# Patient Record
Sex: Male | Born: 1960 | Race: White | Hispanic: No | Marital: Married
Health system: Southern US, Community
[De-identification: ages and names within clinical notes are randomized; demographics above are authoritative.]

## PROBLEM LIST (undated history)

## (undated) DIAGNOSIS — I10 Essential (primary) hypertension: Secondary | ICD-10-CM

---

## 2010-11-09 ENCOUNTER — Emergency Department (HOSPITAL_BASED_OUTPATIENT_CLINIC_OR_DEPARTMENT_OTHER)
Admission: EM | Admit: 2010-11-09 | Discharge: 2010-11-09 | Payer: Self-pay | Source: Home / Self Care | Admitting: Emergency Medicine

## 2011-07-30 ENCOUNTER — Inpatient Hospital Stay (HOSPITAL_COMMUNITY)
Admission: EM | Admit: 2011-07-30 | Discharge: 2011-08-01 | DRG: 313 | Disposition: A | Discharge status: Home / Self Care | Payer: Self-pay | Attending: Internal Medicine | Admitting: Internal Medicine

## 2011-07-30 ENCOUNTER — Emergency Department (HOSPITAL_COMMUNITY): Payer: Self-pay

## 2011-07-30 DIAGNOSIS — M549 Dorsalgia, unspecified: Secondary | ICD-10-CM | POA: Diagnosis present

## 2011-07-30 DIAGNOSIS — K219 Gastro-esophageal reflux disease without esophagitis: Secondary | ICD-10-CM | POA: Diagnosis present

## 2011-07-30 DIAGNOSIS — R0789 Other chest pain: Principal | ICD-10-CM | POA: Diagnosis present

## 2011-07-30 DIAGNOSIS — I10 Essential (primary) hypertension: Secondary | ICD-10-CM | POA: Diagnosis present

## 2011-07-30 DIAGNOSIS — I452 Bifascicular block: Secondary | ICD-10-CM | POA: Diagnosis present

## 2011-07-30 LAB — DIFFERENTIAL
Basophils Absolute: 0 10*3/uL (ref 0.0–0.1)
Basophils Relative: 0 % (ref 0–1)
Eosinophils Absolute: 0.3 10*3/uL (ref 0.0–0.7)
Eosinophils Relative: 3 % (ref 0–5)
Lymphocytes Relative: 34 % (ref 12–46)
Lymphs Abs: 3.3 10*3/uL (ref 0.7–4.0)
Monocytes Absolute: 0.7 10*3/uL (ref 0.1–1.0)
Monocytes Relative: 7 % (ref 3–12)
Neutro Abs: 5.3 10*3/uL (ref 1.7–7.7)
Neutrophils Relative %: 55 % (ref 43–77)

## 2011-07-30 LAB — CBC
HCT: 41.3 % (ref 39.0–52.0)
Hemoglobin: 14.6 g/dL (ref 13.0–17.0)
MCH: 30.6 pg (ref 26.0–34.0)
MCHC: 35.4 g/dL (ref 30.0–36.0)
MCV: 86.6 fL (ref 78.0–100.0)
Platelets: 243 10*3/uL (ref 150–400)
RBC: 4.77 MIL/uL (ref 4.22–5.81)
RDW: 12.7 % (ref 11.5–15.5)
WBC: 9.5 10*3/uL (ref 4.0–10.5)

## 2011-07-30 LAB — POCT I-STAT TROPONIN I: Troponin i, poc: 0.01 ng/mL (ref 0.00–0.08)

## 2011-07-31 ENCOUNTER — Observation Stay (HOSPITAL_COMMUNITY): Payer: Self-pay

## 2011-07-31 ENCOUNTER — Emergency Department (HOSPITAL_COMMUNITY): Payer: Self-pay

## 2011-07-31 ENCOUNTER — Encounter (HOSPITAL_COMMUNITY): Payer: Self-pay | Admitting: Radiology

## 2011-07-31 DIAGNOSIS — R079 Chest pain, unspecified: Secondary | ICD-10-CM

## 2011-07-31 LAB — CARDIAC PANEL(CRET KIN+CKTOT+MB+TROPI)
CK, MB: 2.4 ng/mL (ref 0.3–4.0)
CK, MB: 2.4 ng/mL (ref 0.3–4.0)
Relative Index: INVALID (ref 0.0–2.5)
Relative Index: INVALID (ref 0.0–2.5)
Total CK: 85 U/L (ref 7–232)
Troponin I: <0.3 ng/mL (ref <0.30)
Troponin I: <0.3 ng/mL (ref <0.30)

## 2011-07-31 LAB — LIPID PANEL
Cholesterol: 208 mg/dL — ABNORMAL HIGH (ref 0–200)
Total CHOL/HDL Ratio: 5.9 RATIO

## 2011-07-31 LAB — BASIC METABOLIC PANEL
BUN: 24 mg/dL — ABNORMAL HIGH (ref 6–23)
BUN: 24 mg/dL — ABNORMAL HIGH (ref 6–23)
CO2: 27 mEq/L (ref 19–32)
Calcium: 9.4 mg/dL (ref 8.4–10.5)
Chloride: 106 mEq/L (ref 96–112)
Chloride: 103 mEq/L (ref 96–112)
Creatinine, Ser: 1.13 mg/dL (ref 0.50–1.35)
Creatinine, Ser: 0.95 mg/dL (ref 0.50–1.35)
GFR calc Af Amer: >60 mL/min (ref >60)
GFR calc Af Amer: >60 mL/min (ref >60)
GFR calc non Af Amer: >60 mL/min (ref >60)
GFR calc non Af Amer: >60 mL/min (ref >60)
Glucose, Bld: 115 mg/dL — ABNORMAL HIGH (ref 70–99)
Potassium: 4.1 mEq/L (ref 3.5–5.1)
Sodium: 142 mEq/L (ref 135–145)

## 2011-07-31 LAB — CBC
HCT: 41.3 % (ref 39.0–52.0)
MCV: 87.7 fL (ref 78.0–100.0)
Platelets: 249 10*3/uL (ref 150–400)
RBC: 4.71 MIL/uL (ref 4.22–5.81)
WBC: 11.7 10*3/uL — ABNORMAL HIGH (ref 4.0–10.5)

## 2011-07-31 MED ORDER — TECHNETIUM TC 99M TETROFOSMIN IV KIT
10.0000 | PACK | Freq: Once | INTRAVENOUS | Status: AC | PRN
  Administered 2011-07-31: 10 via INTRAVENOUS

## 2011-07-31 MED ORDER — TECHNETIUM TC 99M TETROFOSMIN IV KIT
30.0000 | PACK | Freq: Once | INTRAVENOUS | Status: AC | PRN
  Administered 2011-07-31: 30 via INTRAVENOUS

## 2011-07-31 MED ORDER — IOHEXOL 350 MG/ML SOLN
100.0000 mL | Freq: Once | INTRAVENOUS | Status: AC | PRN
Start: 2011-07-31 — End: 2011-07-31
  Administered 2011-07-31: 100 mL via INTRAVENOUS

## 2011-07-31 NOTE — H&P (Signed)
Larry Browning, [name]:  [id number]  MEDICAL RECORD NO.:  [id number]  LOCATION:  [facility]                         FACILITY:  [facility]  PHYSICIAN:  [name], MD      DATE OF BIRTH:  Oct 23, 1961  DATE OF ADMISSION:  07/30/2011 DATE OF DISCHARGE:                             HISTORY & PHYSICAL   PRIMARY CARE PHYSICIAN:  Dr. Bonnetta Barry.  The patient has no cardiologist.  The patient is DNR.  The patient goes to team 4.  CHIEF COMPLAINT:  Chest pain.  HISTORY OF PRESENT ILLNESS:  This is a 50 year old male with no significant health issues.  He states that earlier today he was lying on a bed and found that he could not take a deep breath.  He is decided to go and take shower to see if this will improve; however, his dyspnea became worse.  He states at that point he started panic and he came to the ER.  He also complains of left sided chest pain which radiated to the left arm.  He has also been having left arm numbness.  The symptoms was worse with activity and they were improved with nitroglycerin in the ER.  He describes the chest pain more as a burning, ranked 10/10 at its worst.  He also states he was hypoventilating.  He reports palpation, wheezing.  He reports no lower extremity edema.  No cough.  He does not have any history of coronary artery disease.  No history of anxiety.  He does have a history of heartburn which has fair control.  He does have a history of tobacco use, but quit over a year ago.  He states that he was diaphoretic and he felt a little bit oozy at times.  He does have a history of cervical spinal surgery which had symptoms of numbness in bilateral upper extremities; however, he describes these episodes today as being different from that.  He reports no fevers.  No chills.  No nausea or vomiting.  No abdominal pain.  No altered mental status.  No slurred speech.  No weight loss.  History obtained from the patient who  appears reliable.  REVIEW OF SYSTEMS:  All 10-point systems reviewed and are negative except as noted in the HPI.  Past medical history is negative.  The patient did have a history of hypertension.  PAST SURGICAL HISTORY:  Cervical spine surgery.  MEDICATIONS:  None.  ALLERGIES:  None.  SOCIAL HISTORY:  Negative for tobacco, alcohol, or illicit drug.  Lives with his wife.  No home oxygen.  FAMILY HISTORY:  Significant for hypertension.  PHYSICAL EXAMINATION:  VITAL SIGNS:  Blood pressure 125/75 (currently), pulse initially 105, currently 71; respiratory rate 21, temperature 98.3, satting 98% on room air. GENERAL:  Alert and oriented male, currently in no acute distress. HEENT:  EYES, pink conjunctivae.  PERRLA.  ENT, moist oral mucosa. NECK:  Trachea midline.  Neck is supple.  No thyromegaly. LUNGS:  Clear to auscultation bilaterally.  No use of accessory muscles. No wheezing. CARDIOVASCULAR:  Regular rate and rhythm without murmurs, rigors, or gallops.  No JVD.  No carotid bruits. ABDOMEN:  Soft, positive bowel sounds,  nontender, nondistended.  No organomegaly. NEURO:  Cranial nerves II through XII grossly intact.  Sensation intact. MUSCULOSKELETAL:  Strength 5/5 in all extremities.  No clubbing, cyanosis, or edema.  The patient does have a single area of point tenderness in the left chest wall different from what he was describing previously. SKIN:  No rashes.  No subcutaneous crepitations.  No decubitus. PSYCH:  Alert, oriented, appropriate male.  Last EKG shows no ST-segment changes.  The patient does have a right bundle-branch block.  CT angio, borderline cardiomegaly.  Negative for PE.  Chest x-ray, no acute cardiopulmonary disease.  Sodium 142, potassium 4.1, chloride 106, CO2 of 27, glucose 115, BUN 24, creatinine 1.13.  Troponin 0.010.  The white blood count 9.5, hemoglobin 14.6, platelets 243.  ASSESSMENT AND PLAN:  Chest pain.  The patient will be  admitted.  We will cycle cardiac enzyme.  Monitor the patient on tele.  Check a lipid panel.  Order a Hydrographic surveyor.  [facility] is aware of the patient.  They were called by the ER physician.  They will likely see the patient in consultation in the a.m.  No beta-blockers have been started as the patient's blood pressure will not tolerate.  DVT prophylaxis.  Aspirin, nitropaste and PPI has also been started.          ______________________________ [name], MD     DC/MEDQ  D:  07/31/2011  T:  07/31/2011  Job:  [id number]  Electronically Signed by [name] MD on 07/31/2011 05:36:06 AM

## 2011-08-01 LAB — CBC
MCV: 88.5 fL (ref 78.0–100.0)
Platelets: 238 10*3/uL (ref 150–400)
RBC: 4.77 MIL/uL (ref 4.22–5.81)
RDW: 12.9 % (ref 11.5–15.5)
WBC: 10.8 10*3/uL — ABNORMAL HIGH (ref 4.0–10.5)

## 2011-08-01 LAB — COMPREHENSIVE METABOLIC PANEL
ALT: 20 U/L (ref 0–53)
AST: 19 U/L (ref 0–37)
Albumin: 3.6 g/dL (ref 3.5–5.2)
Alkaline Phosphatase: 79 U/L (ref 39–117)
CO2: 25 mEq/L (ref 19–32)
Chloride: 101 mEq/L (ref 96–112)
GFR calc non Af Amer: >60 mL/min (ref >60)
Potassium: 4.1 mEq/L (ref 3.5–5.1)
Sodium: 137 mEq/L (ref 135–145)
Total Bilirubin: 0.9 mg/dL (ref 0.3–1.2)

## 2011-08-01 LAB — HEMOGLOBIN A1C: Hgb A1c MFr Bld: 6 % — ABNORMAL HIGH (ref <5.7)

## 2020-10-31 ENCOUNTER — Emergency Department (HOSPITAL_BASED_OUTPATIENT_CLINIC_OR_DEPARTMENT_OTHER)
Admission: EM | Admit: 2020-10-31 | Discharge: 2020-10-31 | Disposition: A | Discharge status: Home / Self Care | Payer: BLUE CROSS/BLUE SHIELD | Attending: Emergency Medicine | Admitting: Emergency Medicine

## 2020-10-31 ENCOUNTER — Encounter (HOSPITAL_BASED_OUTPATIENT_CLINIC_OR_DEPARTMENT_OTHER): Payer: Self-pay | Admitting: *Deleted

## 2020-10-31 ENCOUNTER — Emergency Department (HOSPITAL_BASED_OUTPATIENT_CLINIC_OR_DEPARTMENT_OTHER): Payer: BLUE CROSS/BLUE SHIELD

## 2020-10-31 ENCOUNTER — Other Ambulatory Visit: Payer: Self-pay

## 2020-10-31 DIAGNOSIS — S99911A Unspecified injury of right ankle, initial encounter: Secondary | ICD-10-CM | POA: Diagnosis present

## 2020-10-31 DIAGNOSIS — I10 Essential (primary) hypertension: Secondary | ICD-10-CM | POA: Diagnosis not present

## 2020-10-31 DIAGNOSIS — S93401A Sprain of unspecified ligament of right ankle, initial encounter: Secondary | ICD-10-CM | POA: Diagnosis not present

## 2020-10-31 DIAGNOSIS — Z79899 Other long term (current) drug therapy: Secondary | ICD-10-CM | POA: Diagnosis not present

## 2020-10-31 DIAGNOSIS — W010XXA Fall on same level from slipping, tripping and stumbling without subsequent striking against object, initial encounter: Secondary | ICD-10-CM | POA: Diagnosis not present

## 2020-10-31 DIAGNOSIS — M25571 Pain in right ankle and joints of right foot: Secondary | ICD-10-CM

## 2020-10-31 HISTORY — DX: Essential (primary) hypertension: I10

## 2020-10-31 NOTE — Discharge Instructions (Signed)
Your x-rays today did not show acute fracture dislocation.  I suspect you sprained your ankle and may have ligamentous or soft tissue injuries.  Please use the walker and crutches as you have in the past and follow-up with sports medicine if it persist.  We discussed the possibility of hidden fracture called occult fracture.  Please use over-the-counter medications, rest, elevation, and ice to help with the discomfort.  If any symptoms change or worsen, please return to the nearest emergency department.

## 2020-10-31 NOTE — ED Notes (Signed)
Pt states was taking the trash out and slipped on wet grass right foot twisted inward and hear crunching sound. Swelling noted to right ankle.

## 2020-10-31 NOTE — ED Provider Notes (Signed)
[facility] EMERGENCY DEPARTMENT Provider Note   CSN: [id number] Arrival date & time: 10/31/20  1849     History Chief Complaint  Patient presents with  . Foot Injury    Larry Browning is a 59 y.o. male.  The history is provided by the patient, the spouse and medical records. No language interpreter was used.  Foot Injury Location:  Ankle Time since incident:  2 hours Injury: yes   Mechanism of injury: fall   Fall:    Entrapped after fall: no   Ankle location:  R ankle Pain details:    Quality:  Aching, sharp and dull   Radiates to:  Does not radiate   Severity:  Severe   Onset quality:  Sudden   Timing:  Constant   Progression:  Unchanged Tetanus status:  Unknown Prior injury to area:  No Relieved by:  Nothing Worsened by:  Bearing weight, flexion and extension Associated symptoms: no back pain, no fatigue, no fever, no itching, no neck pain, no numbness and no stiffness        Past Medical History:  Diagnosis Date  . Hypertension     There are no problems to display for this patient.   History reviewed. No pertinent surgical history.     No family history on file.     Home Medications Prior to Admission medications   Medication Sig Start Date End Date Taking? Authorizing Provider  amLODipine (NORVASC) 5 MG tablet Take 1 tablet by mouth daily. 03/28/20  Yes [provider]  omeprazole (PRILOSEC) 20 MG capsule Take 1 tablet by mouth daily. 03/28/20  Yes [provider]    Allergies    Patient has no known allergies.  Review of Systems   Review of Systems  Constitutional: Negative for chills, fatigue and fever.  HENT: Negative for congestion.   Respiratory: Negative for cough, chest tightness, shortness of breath and wheezing.   Cardiovascular: Negative for chest pain and palpitations.  Gastrointestinal: Negative for constipation, diarrhea, nausea and vomiting.  Genitourinary: Negative for flank pain.   Musculoskeletal: Negative for back pain, neck pain and stiffness.  Skin: Negative for itching and wound.  Neurological: Negative for light-headedness.  Psychiatric/Behavioral: Negative for agitation.  All other systems reviewed and are negative.   Physical Exam Updated Vital Signs BP (!) 173/93   Pulse 90   Temp 98.2 F (36.8 C)   Resp 18   Ht 5\' 9"  (1.753 m)   Wt 100.7 kg   SpO2 100%   BMI 32.78 kg/m   Physical Exam Vitals and nursing note reviewed.  Constitutional:      General: He is not in acute distress.    Appearance: He is well-developed and well-nourished. He is not ill-appearing, toxic-appearing or diaphoretic.  HENT:     Head: Normocephalic and atraumatic.     Nose: Nose normal.     Mouth/Throat:     Mouth: Mucous membranes are moist.  Eyes:     Conjunctiva/sclera: Conjunctivae normal.  Cardiovascular:     Rate and Rhythm: Normal rate and regular rhythm.     Heart sounds: No murmur heard.   Pulmonary:     Effort: Pulmonary effort is normal. No respiratory distress.     Breath sounds: Normal breath sounds. No wheezing, rhonchi or rales.  Chest:     Chest wall: No tenderness.  Abdominal:     General: Abdomen is flat.     Palpations: Abdomen is soft.     Tenderness: There  is no abdominal tenderness. There is no right CVA tenderness, left CVA tenderness, guarding or rebound.  Musculoskeletal:        General: Swelling, tenderness and signs of injury present. No edema.     Cervical back: Neck supple.     Right lower leg: No edema.     Left lower leg: No edema.     Right ankle: Swelling and laceration present. No deformity. Tenderness present. Normal pulse.     Right Achilles Tendon: No tenderness.       Legs:  Skin:    General: Skin is warm and dry.     Capillary Refill: Capillary refill takes less than 2 seconds.     Findings: No erythema.  Neurological:     General: No focal deficit present.     Mental Status: He is alert.  Psychiatric:         Mood and Affect: Mood and affect and mood normal.     ED Results / Procedures / Treatments   Labs (all labs ordered are listed, but only abnormal results are displayed) Labs Reviewed - No data to display  EKG None  Radiology DG Ankle Complete Right  Result Date: 10/31/2020 CLINICAL DATA:  Injury EXAM: RIGHT ANKLE - COMPLETE 3+ VIEW COMPARISON:  None. FINDINGS: There is no evidence of fracture, dislocation, or joint effusion. There is no evidence of arthropathy or other focal bone abnormality. Calcaneal enthesophyte is seen. Soft tissues are unremarkable. IMPRESSION: Negative. Electronically Signed   By: Jonna Clark M.D.   On: 10/31/2020 19:20    Procedures Procedures (including critical care time)  Medications Ordered in ED Medications - No data to display  ED Course  I have reviewed the triage vital signs and the nursing notes.  Pertinent labs & imaging results that were available during my care of the patient were reviewed by me and considered in my medical decision making (see chart for details).    MDM Rules/Calculators/A&P                          Larry Browning is a 59 y.o. male with a past medical history significant for hypertension who presents with right ankle injury.  Patient reports he was taking the trash when he slipped and fell and felt his ankle bent backwards and heard a loud crack.  He initially thought he broke his ankle and could not bear weight.  He presents for evaluation.  He denies any other injuries or any other complaints.  He reports he has injured his left ankle in the past but it was likely just a sprain and he improved without having to be seen by orthopedics.  He denies any other complaints and any preceding symptoms.  Denies any URI symptoms or Covid symptoms.  He describes his pain as moderate to severe in the right ankle with swelling.  Denies any numbness, tingling, or weakness distally.  Denies any knee or hip or shin pain.  On exam, patient  has swelling and tenderness in the right ankle but has intact sensation and strength in the foot.  Normal range of motion with pain.  Intact DP and PT pulse.  No tenderness in the calf, shin, or knee.  No laceration seen.  No foot tenderness.  Exam otherwise unremarkable with lungs clear and chest nontender and back nontender.  Patient had x-ray showing no acute fracture or ankle effusion.  I suspect likely an ankle sprain or even ligamentous  injury with transient dislocation or subluxation of the ankle from this bending back reported.  We will place him into a Cam walker and given crutches.  He will follow up with sports medicine and PCP.  We discussed the possibility of occult injury.  He agreed with plan of care and follow-up instructions.  He no other questions or concerns and was discharged in good condition after reassuring imaging.    Final Clinical Impression(s) / ED Diagnoses Final diagnoses:  Sprain of right ankle, unspecified ligament, initial encounter  Acute right ankle pain    Rx / DC Orders ED Discharge Orders    None     Clinical Impression: 1. Sprain of right ankle, unspecified ligament, initial encounter   2. Acute right ankle pain     Disposition: Discharge  Condition: Good  I have discussed the results, Dx and Tx plan with the pt(& family if present). He/she/they expressed understanding and agree(s) with the plan. Discharge instructions discussed at great length. Strict return precautions discussed and pt &/or family have verbalized understanding of the instructions. No further questions at time of discharge.    Discharge Medication List as of 10/31/2020 10:46 PM      Follow Up: Myra Rude, MD [address] [place] [address] [phone]     [facility]PART[facility] [address] [id number] [place] [address] [phone]       Keishawn Darsey, Canary Brim, MD 11/01/20 [phone]

## 2020-10-31 NOTE — ED Triage Notes (Signed)
C/o right ankle injury x 2 hrs ago  

## 2020-11-04 ENCOUNTER — Telehealth: Payer: Self-pay | Admitting: Family Medicine

## 2020-11-04 NOTE — Telephone Encounter (Signed)
Called pt to offer ED follow up trmt w/Dr.Schmitz--no answer, left message.  -glh

## 2022-07-25 IMAGING — CR DG ANKLE COMPLETE 3+V*R*
3 series · 3 of 3 positions shown · non-contrast
Comparison: None.

CLINICAL DATA: Injury

EXAM:
RIGHT ANKLE - COMPLETE 3+ VIEW

[t ankle joint ap right]
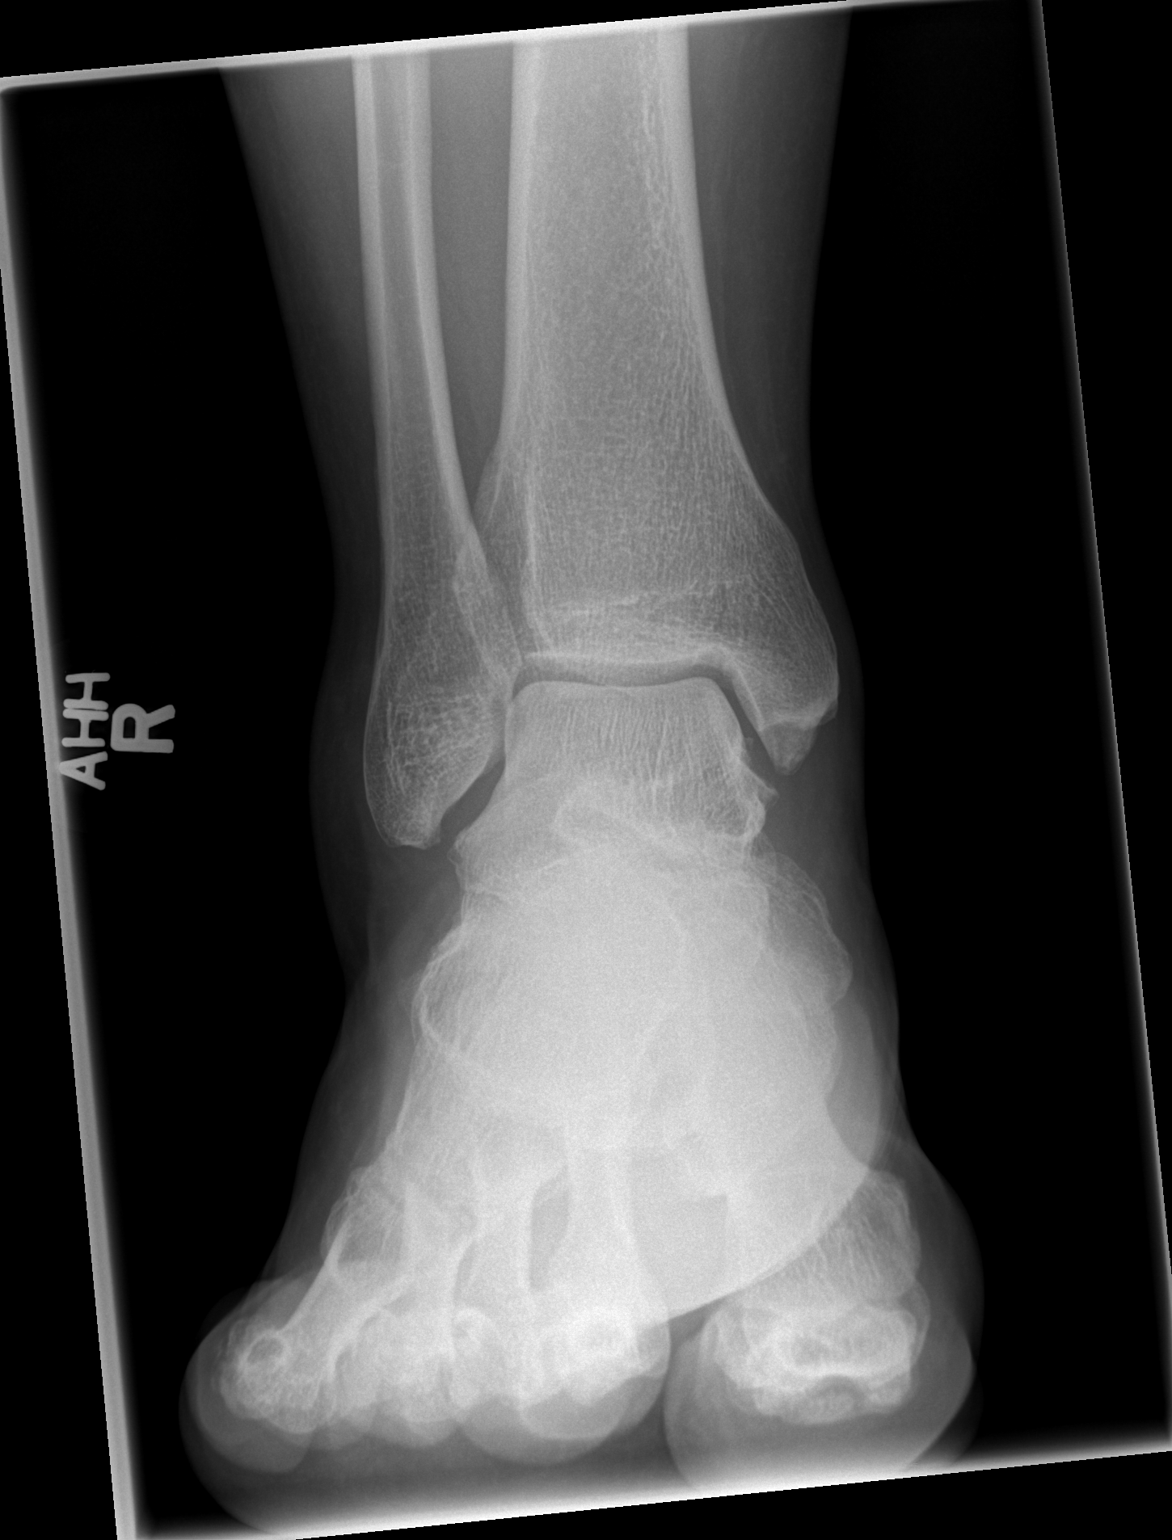

[t ankle joint oblique right]
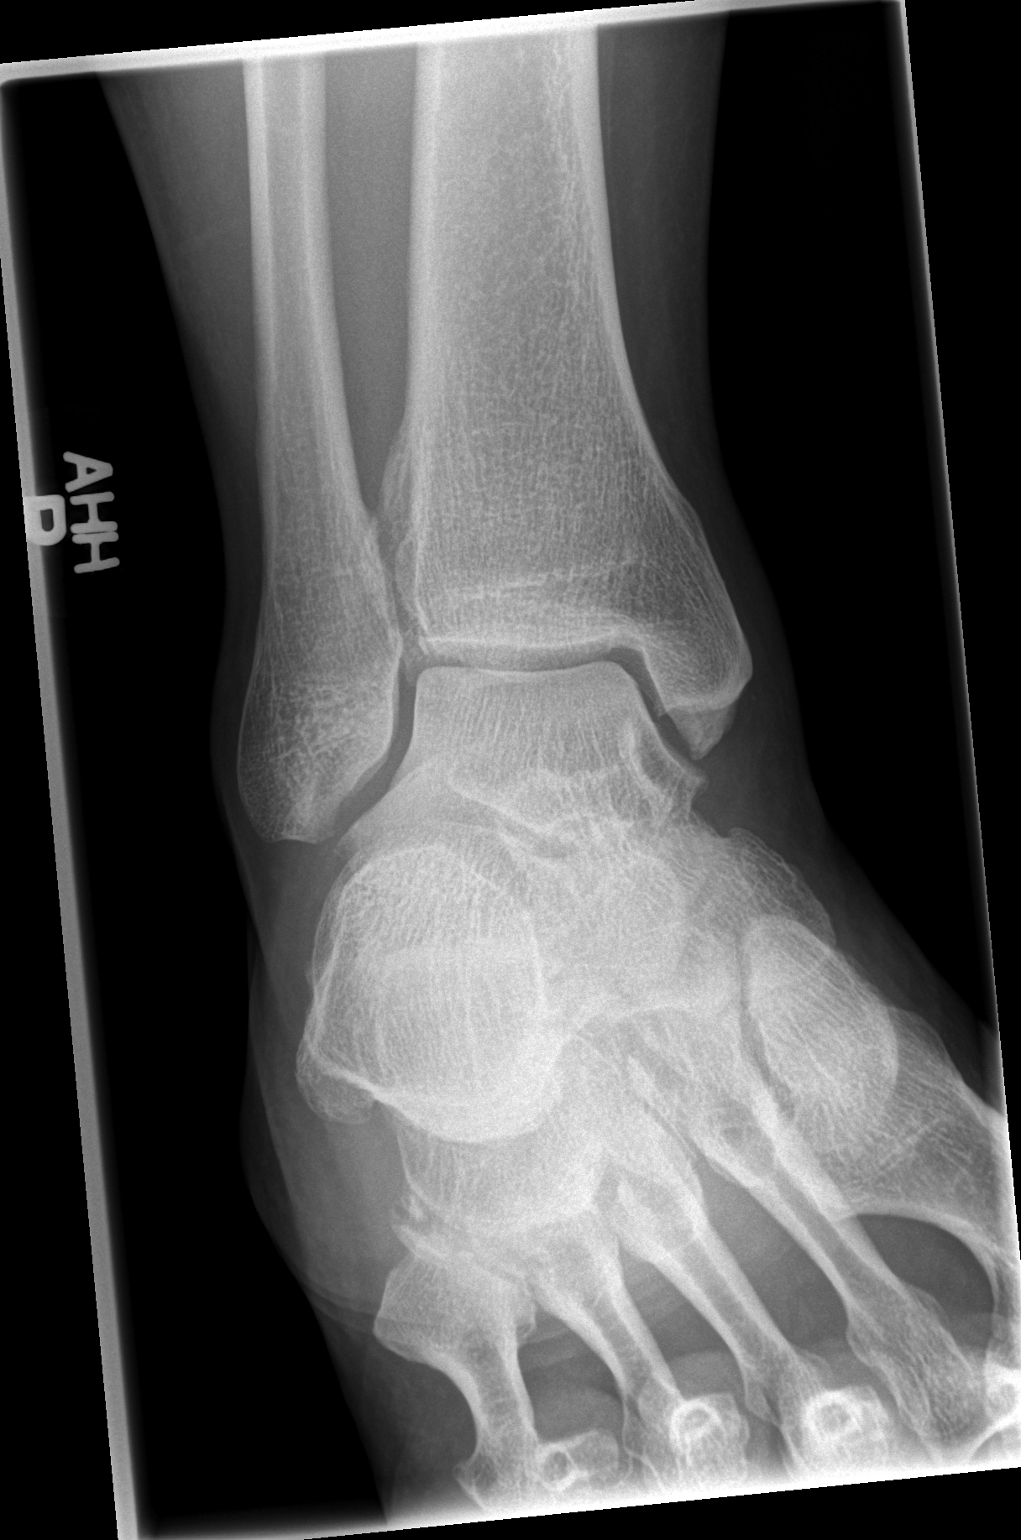

[t ankle joint lat right]
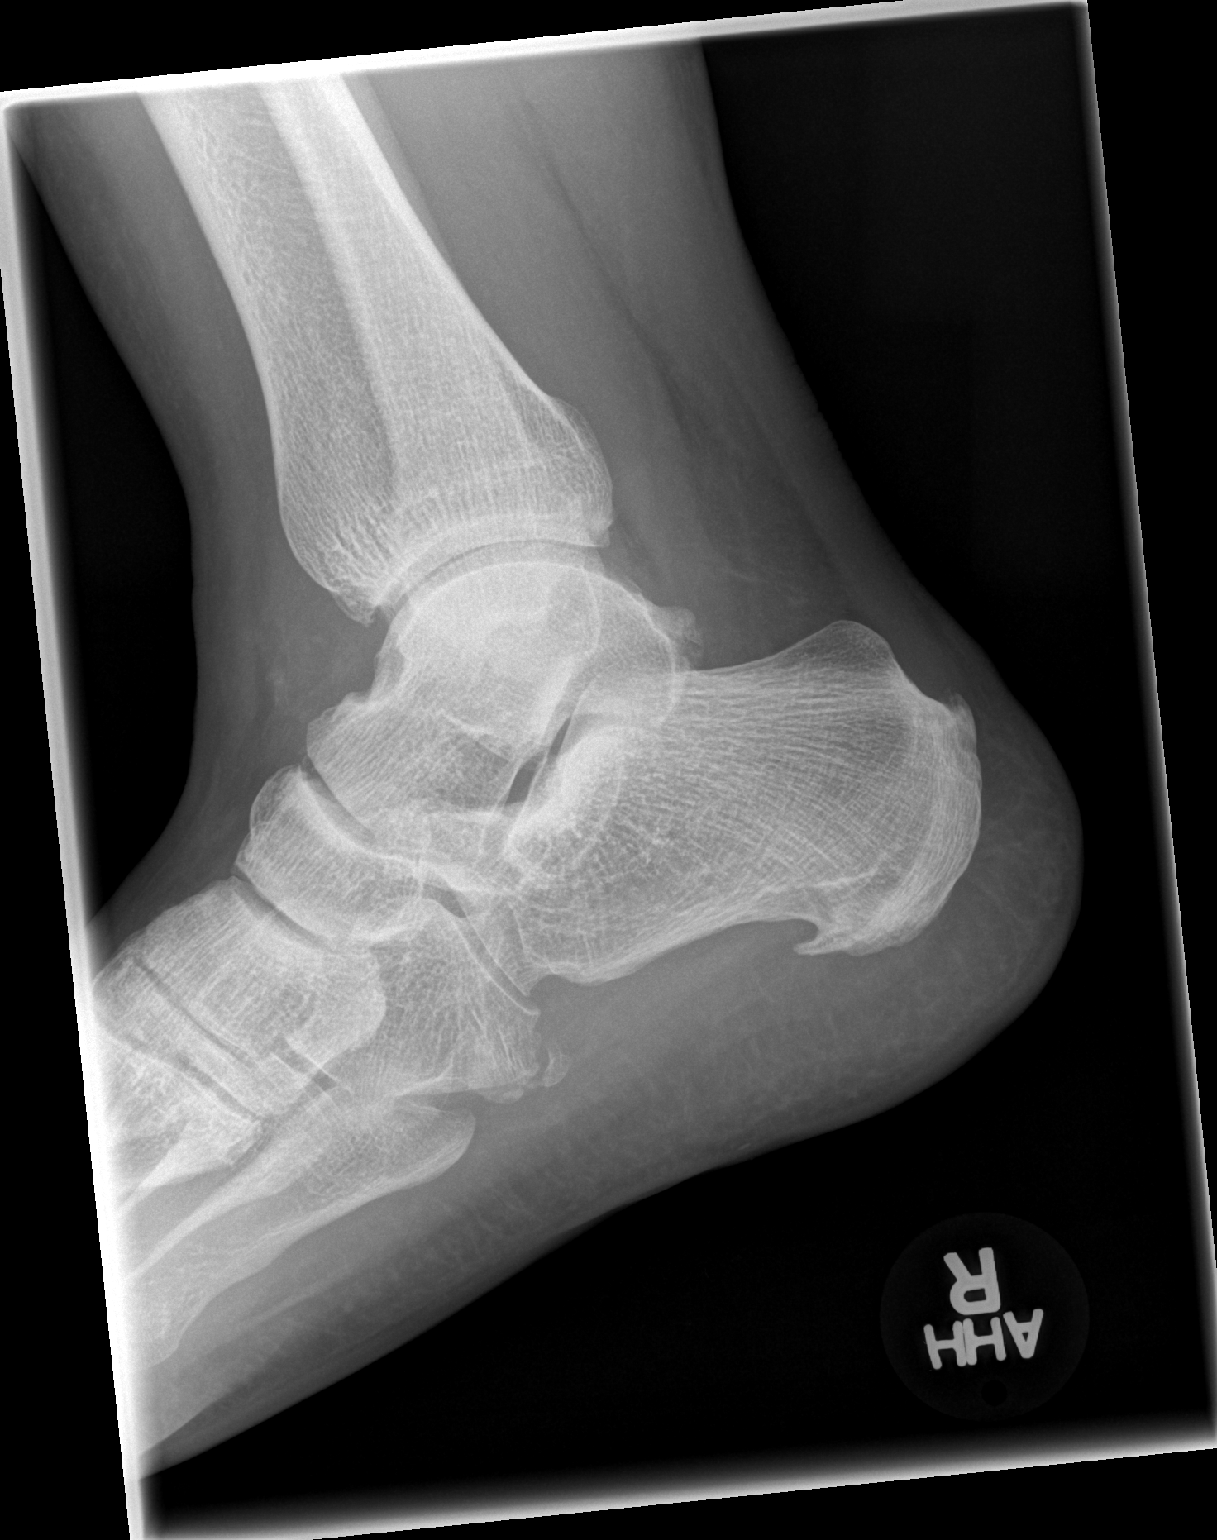

[3 of 3 positions shown; findings below may reference images not displayed]

FINDINGS: There is no evidence of fracture, dislocation, or joint effusion.
There is no evidence of arthropathy or other focal bone abnormality.
Calcaneal enthesophyte is seen. Soft tissues are unremarkable.
IMPRESSION: Negative.

## 2023-02-17 ENCOUNTER — Encounter: Payer: Self-pay | Admitting: *Deleted

## 2024-09-20 ENCOUNTER — Other Ambulatory Visit: Payer: Self-pay | Admitting: Urology

## 2024-09-20 DIAGNOSIS — R972 Elevated prostate specific antigen [PSA]: Secondary | ICD-10-CM

## 2024-09-21 ENCOUNTER — Encounter: Payer: Self-pay | Admitting: Urology

## 2024-10-28 ENCOUNTER — Ambulatory Visit
Admission: RE | Admit: 2024-10-28 | Discharge: 2024-10-28 | Disposition: A | Source: Ambulatory Visit | Attending: Urology

## 2024-10-28 DIAGNOSIS — R972 Elevated prostate specific antigen [PSA]: Secondary | ICD-10-CM
